# Patient Record
Sex: Female | Born: 1990 | Race: Black or African American | Hispanic: No | Marital: Single | Smoking: Current every day smoker
Health system: Southern US, Community
[De-identification: ages and names within clinical notes are randomized; demographics above are authoritative.]

## PROBLEM LIST (undated history)

## (undated) DIAGNOSIS — J45909 Unspecified asthma, uncomplicated: Secondary | ICD-10-CM

---

## 2013-11-05 ENCOUNTER — Encounter (HOSPITAL_BASED_OUTPATIENT_CLINIC_OR_DEPARTMENT_OTHER): Payer: Self-pay | Admitting: Emergency Medicine

## 2013-11-05 ENCOUNTER — Emergency Department (HOSPITAL_BASED_OUTPATIENT_CLINIC_OR_DEPARTMENT_OTHER)
Admission: EM | Admit: 2013-11-05 | Discharge: 2013-11-05 | Disposition: A | Discharge status: Home / Self Care | Payer: 59 | Attending: Emergency Medicine | Admitting: Emergency Medicine

## 2013-11-05 DIAGNOSIS — N898 Other specified noninflammatory disorders of vagina: Secondary | ICD-10-CM

## 2013-11-05 DIAGNOSIS — N9489 Other specified conditions associated with female genital organs and menstrual cycle: Secondary | ICD-10-CM | POA: Insufficient documentation

## 2013-11-05 DIAGNOSIS — N899 Noninflammatory disorder of vagina, unspecified: Secondary | ICD-10-CM | POA: Insufficient documentation

## 2013-11-05 DIAGNOSIS — F17200 Nicotine dependence, unspecified, uncomplicated: Secondary | ICD-10-CM | POA: Insufficient documentation

## 2013-11-05 DIAGNOSIS — Z3202 Encounter for pregnancy test, result negative: Secondary | ICD-10-CM | POA: Insufficient documentation

## 2013-11-05 LAB — URINALYSIS, ROUTINE W REFLEX MICROSCOPIC
Glucose, UA: NEGATIVE mg/dL
Leukocytes, UA: NEGATIVE
Protein, ur: NEGATIVE mg/dL
Urobilinogen, UA: 1 mg/dL (ref 0.0–1.0)
pH: 6.5 (ref 5.0–8.0)

## 2013-11-05 LAB — WET PREP, GENITAL: Trich, Wet Prep: NONE SEEN

## 2013-11-05 LAB — PREGNANCY, URINE: Preg Test, Ur: NEGATIVE

## 2013-11-05 LAB — URINE MICROSCOPIC-ADD ON

## 2013-11-05 MED ORDER — HYDROCORTISONE 1 % EX CREA
TOPICAL_CREAM | CUTANEOUS | Status: AC
  Filled 2013-11-05: qty 28

## 2013-11-05 MED ORDER — HYDROCORTISONE 1 % EX CREA
TOPICAL_CREAM | Freq: Once | CUTANEOUS | Status: AC
  Administered 2013-11-05: 1 via TOPICAL

## 2013-11-05 NOTE — ED Notes (Signed)
Pt reports vaginal irritation that started yesterday.  Denies discharge

## 2013-11-05 NOTE — ED Provider Notes (Signed)
CSN: [id number]     Arrival date & time 11/05/13  0003 History   First MD Initiated Contact with Patient 11/05/13 0019     Chief Complaint  Patient presents with  . Vaginal Itching   (Consider location/radiation/quality/duration/timing/severity/associated sxs/prior Treatment) Patient is a 22 y.o. female presenting with vaginal itching.  Vaginal Itching   Pt reports 2 days of vaginal burning and itching. Denies any discharge, no bleeding or swelling. She reports may have reaction to new detergent. No history of same. No history of HSV. No exposure to latex  History reviewed. No pertinent past medical history. History reviewed. No pertinent past surgical history. History reviewed. No pertinent family history. History  Substance Use Topics  . Smoking status: Current Every Day Smoker -- 0.50 packs/day    Types: Cigarettes  . Smokeless tobacco: Not on file  . Alcohol Use: No     Comment: socially   OB History   Grav Para Term Preterm Abortions TAB SAB Ect Mult Living                 Review of Systems All other systems reviewed and are negative except as noted in HPI.   Allergies  Review of patient's allergies indicates not on file.  Home Medications  No current outpatient prescriptions on file. BP 127/112  Pulse 69  Temp(Src) 98.4 F (36.9 C) (Oral)  Resp 18  Ht 5\' 7"  (1.702 m)  Wt 130 lb (58.968 kg)  BMI 20.36 kg/m2  SpO2 100%  LMP 11/05/2013 Physical Exam  Nursing note and vitals reviewed. Constitutional: She is oriented to person, place, and time. She appears well-developed and well-nourished.  HENT:  Head: Normocephalic and atraumatic.  Eyes: EOM are normal. Pupils are equal, round, and reactive to light.  Neck: Normal range of motion. Neck supple.  Cardiovascular: Normal rate, normal heart sounds and intact distal pulses.   Pulmonary/Chest: Effort normal and breath sounds normal.  Abdominal: Bowel sounds are normal. She exhibits no distension. There is no  tenderness.  Genitourinary:  Several small bumps on labia minora bilaterally, no vesicles or ulcers. No discharge or bleeding, unable to tolerate full speculum exam or bimanual  Musculoskeletal: Normal range of motion. She exhibits no edema and no tenderness.  Neurological: She is alert and oriented to person, place, and time. She has normal strength. No cranial nerve deficit or sensory deficit.  Skin: Skin is warm and dry. No rash noted.  Psychiatric: She has a normal mood and affect.    ED Course  Procedures (including critical care time) Labs Review Labs Reviewed  WET PREP, GENITAL - Abnormal; Notable for the following:    Clue Cells Wet Prep HPF POC FEW (*)    WBC, Wet Prep HPF POC FEW (*)    All other components within normal limits  URINALYSIS, ROUTINE W REFLEX MICROSCOPIC - Abnormal; Notable for the following:    Hgb urine dipstick TRACE (*)    All other components within normal limits  URINE MICROSCOPIC-ADD ON - Abnormal; Notable for the following:    Bacteria, UA MANY (*)    All other components within normal limits  GC/CHLAMYDIA PROBE AMP  PREGNANCY, URINE   Imaging Review No results found.  EKG Interpretation   None       MDM   1. Vaginal irritation     Rash not consistent with HSV. Suspect contact dermatitis of some sort. Advised topical hydrocortisone cream. Gyn followup if not improving.     Charles B. Bernette Mayers,  MD 11/05/13 1610

## 2015-02-15 ENCOUNTER — Emergency Department (HOSPITAL_BASED_OUTPATIENT_CLINIC_OR_DEPARTMENT_OTHER)
Admission: EM | Admit: 2015-02-15 | Discharge: 2015-02-15 | Disposition: A | Discharge status: Home / Self Care | Payer: 59 | Attending: Emergency Medicine | Admitting: Emergency Medicine

## 2015-02-15 ENCOUNTER — Encounter (HOSPITAL_BASED_OUTPATIENT_CLINIC_OR_DEPARTMENT_OTHER): Payer: Self-pay | Admitting: *Deleted

## 2015-02-15 ENCOUNTER — Emergency Department (HOSPITAL_BASED_OUTPATIENT_CLINIC_OR_DEPARTMENT_OTHER): Payer: 59

## 2015-02-15 DIAGNOSIS — Z720 Tobacco use: Secondary | ICD-10-CM | POA: Insufficient documentation

## 2015-02-15 DIAGNOSIS — J45901 Unspecified asthma with (acute) exacerbation: Secondary | ICD-10-CM | POA: Diagnosis not present

## 2015-02-15 DIAGNOSIS — J189 Pneumonia, unspecified organism: Secondary | ICD-10-CM

## 2015-02-15 DIAGNOSIS — R05 Cough: Secondary | ICD-10-CM | POA: Diagnosis present

## 2015-02-15 DIAGNOSIS — J159 Unspecified bacterial pneumonia: Secondary | ICD-10-CM | POA: Insufficient documentation

## 2015-02-15 DIAGNOSIS — R079 Chest pain, unspecified: Secondary | ICD-10-CM

## 2015-02-15 HISTORY — DX: Unspecified asthma, uncomplicated: J45.909

## 2015-02-15 MED ORDER — ALBUTEROL SULFATE HFA 108 (90 BASE) MCG/ACT IN AERS
2.0000 | INHALATION_SPRAY | Freq: Once | RESPIRATORY_TRACT | Status: DC
  Filled 2015-02-15: qty 6.7

## 2015-02-15 MED ORDER — IPRATROPIUM-ALBUTEROL 0.5-2.5 (3) MG/3ML IN SOLN
3.0000 mL | RESPIRATORY_TRACT | Status: DC
  Administered 2015-02-15: 3 mL via RESPIRATORY_TRACT

## 2015-02-15 MED ORDER — DEXAMETHASONE 4 MG PO TABS
12.0000 mg | ORAL_TABLET | Freq: Once | ORAL | Status: AC
  Administered 2015-02-15: 12 mg via ORAL
  Filled 2015-02-15: qty 3

## 2015-02-15 MED ORDER — ALBUTEROL SULFATE HFA 108 (90 BASE) MCG/ACT IN AERS: 2.0000 | INHALATION_SPRAY | RESPIRATORY_TRACT | Status: AC | PRN

## 2015-02-15 MED ORDER — LEVOFLOXACIN 750 MG PO TABS
750.0000 mg | ORAL_TABLET | Freq: Once | ORAL | Status: AC
  Administered 2015-02-15: 750 mg via ORAL
  Filled 2015-02-15: qty 1

## 2015-02-15 MED ORDER — LEVOFLOXACIN 750 MG PO TABS: 750.0000 mg | ORAL_TABLET | Freq: Every day | ORAL | Status: AC

## 2015-02-15 NOTE — Discharge Instructions (Signed)
Pneumonia °Pneumonia is an infection of the lungs.  °CAUSES °Pneumonia may be caused by bacteria or a virus. Usually, these infections are caused by breathing infectious particles into the lungs (respiratory tract). °SIGNS AND SYMPTOMS  °· Cough. °· Fever. °· Chest pain. °· Increased rate of breathing. °· Wheezing. °· Mucus production. °DIAGNOSIS  °If you have the common symptoms of pneumonia, your health care provider will typically confirm the diagnosis with a chest X-ray. The X-ray will show an abnormality in the lung (pulmonary infiltrate) if you have pneumonia. Other tests of your blood, urine, or sputum may be done to find the specific cause of your pneumonia. Your health care provider may also do tests (blood gases or pulse oximetry) to see how well your lungs are working. °TREATMENT  °Some forms of pneumonia may be spread to other people when you cough or sneeze. You may be asked to wear a mask before and during your exam. Pneumonia that is caused by bacteria is treated with antibiotic medicine. Pneumonia that is caused by the influenza virus may be treated with an antiviral medicine. Most other viral infections must run their course. These infections will not respond to antibiotics.  °HOME CARE INSTRUCTIONS  °· Cough suppressants may be used if you are losing too much rest. However, coughing protects you by clearing your lungs. You should avoid using cough suppressants if you can. °· Your health care provider may have prescribed medicine if he or she thinks your pneumonia is caused by bacteria or influenza. Finish your medicine even if you start to feel better. °· Your health care provider may also prescribe an expectorant. This loosens the mucus to be coughed up. °· Take medicines only as directed by your health care provider. °· Do not smoke. Smoking is a common cause of bronchitis and can contribute to pneumonia. If you are a smoker and continue to smoke, your cough may last several weeks after your  pneumonia has cleared. °· A cold steam vaporizer or humidifier in your room or home may help loosen mucus. °· Coughing is often worse at night. Sleeping in a semi-upright position in a recliner or using a couple pillows under your head will help with this. °· Get rest as you feel it is needed. Your body will usually let you know when you need to rest. °PREVENTION °A pneumococcal shot (vaccine) is available to prevent a common bacterial cause of pneumonia. This is usually suggested for: °· People over 65 years old. °· Patients on chemotherapy. °· People with chronic lung problems, such as bronchitis or emphysema. °· People with immune system problems. °If you are over 65 or have a high risk condition, you may receive the pneumococcal vaccine if you have not received it before. In some countries, a routine influenza vaccine is also recommended. This vaccine can help prevent some cases of pneumonia. You may be offered the influenza vaccine as part of your care. °If you smoke, it is time to quit. You may receive instructions on how to stop smoking. Your health care provider can provide medicines and counseling to help you quit. °SEEK MEDICAL CARE IF: °You have a fever. °SEEK IMMEDIATE MEDICAL CARE IF:  °· Your illness becomes worse. This is especially true if you are elderly or weakened from any other disease. °· You cannot control your cough with suppressants and are losing sleep. °· You begin coughing up blood. °· You develop pain which is getting worse or is uncontrolled with medicines. °· Any of the symptoms   which initially brought you in for treatment are getting worse rather than better. °· You develop shortness of breath or chest pain. °MAKE SURE YOU:  °· Understand these instructions. °· Will watch your condition. °· Will get help right away if you are not doing well or get worse. °Document Released: 11/18/2005 Document Revised: 04/04/2014 Document Reviewed: 02/07/2011 °ExitCare® Patient Information ©2015  [employer]. This information is not intended to replace advice given to you by your health care provider. Make sure you discuss any questions you have with your health care provider. ° ° °Emergency Department Resource Guide °1) Find a Doctor and Pay Out of Pocket °Although you won't have to find out who is covered by your insurance plan, it is a good idea to ask around and get recommendations. You will then need to call the office and see if the doctor you have chosen will accept you as a new patient and what types of options they offer for patients who are self-pay. Some doctors offer discounts or will set up payment plans for their patients who do not have insurance, but you will need to ask so you aren't surprised when you get to your appointment. ° °2) Contact Your Local Health Department °Not all health departments have doctors that can see patients for sick visits, but many do, so it is worth a call to see if yours does. If you don't know where your local health department is, you can check in your phone book. The CDC also has a tool to help you locate your state's health department, and many state websites also have listings of all of their local health departments. ° °3) Find a Walk-in Clinic °If your illness is not likely to be very severe or complicated, you may want to try a walk in clinic. These are popping up all over the country in pharmacies, drugstores, and shopping centers. They're usually staffed by nurse practitioners or physician assistants that have been trained to treat common illnesses and complaints. They're usually fairly quick and inexpensive. However, if you have serious medical issues or chronic medical problems, these are probably not your best option. ° °No Primary Care Doctor: °- Call [facility] at  832-8000 - they can help you locate a primary care doctor that  accepts your insurance, provides certain services, etc. °- Physician Referral Service- 1-800-533-3463 ° °Chronic Pain  Problems: °Organization         Address  Phone   Notes  °[facility]  (336) 297-2271 Patients need to be referred by their primary care doctor.  ° °Medication Assistance: °Organization         Address  Phone   Notes  °[place] [facility] [address] °[place], [place] [address] (336) 641-8030 --Must be a resident of [place] °-- Must have NO insurance coverage whatsoever (no Medicaid/ Medicare, etc.) °-- The pt. MUST have a primary care doctor that directs their care regularly and follows them in the community °  °[facility]  (866) 331-1348   °[employer]  (888) 892-1162   ° °Agencies that provide inexpensive medical care: °Organization         Address  Phone   Notes  °[facility]  (336) 832-8035   °[facility]    (336) 832-7272   °[facility] [address] °[place], [place] [address] (336) 832-4777   °[facility] [address], °[place] (336) 271-4999   °  [facility]    (336) 373-0678   °[facility]    (336) 272-1050   °[facility] ° [address], [place] Phone:  (336) 832-4444, Fax:  (336) 832-4440 Hours of Operation:  9 am - 6 pm, M-F.  Also accepts Medicaid/Medicare and self-pay.  °[facility] ° [address], [place] Phone: (336) [phone], Fax: (336) [phone]. Hours of Operation:  8:30 am - 5:30 pm, M-F.  Also accepts Medicaid and self-pay.  °[facility] [place] [address], [place] Phone: (336) 878-6027   °[facility] [address], [place], [place] (336)723-1848, Ext. 123 Mondays & Thursdays: 7-9 AM.  First 15 patients are seen on a first come, first serve basis. °  ° °Medicaid-accepting [place] Providers: ° °Organization         Address  Phone   Notes  °[facility] [address], [place] (336) 641-2100 Also  accepts self-pay patients.  °[facility] [address], [place] ° (336) 856-9996   °[facility] [address], [place] (336) 288-8857   °[facility] [address] [place] [address], [place] (336) 299-7000   °Veita Bland [address], [place]  ° (336) 373-1557 Only accepts [place] Access Medicaid patients after they have their name applied to their card.  ° °Self-Pay (no insurance) in [place]: ° °Organization         Address  Phone   Notes  °Sickle Cell Patients, [facility] [address], [place] (336) 832-1970   °[facility] [address], [place] (336) 832-4400   ° [facility] ° [address], [place] (336) 992-4800   °[facility]/Dr. Osei-Bonsu ° [address] [place] [address], [place] or [address], [place] (336) 841-8500 Phone number for both [place] and [place] locations is the same.  °[facility] [address], [place] (336) 299-0000   °[facility] [address] [place] [address], [place] or [address] (336) 852-7530 °(336) 878-2260   °[facility] [address], [place] (336) 350-1642, phone; (336) 294-5005, fax Sees patients 1st and 3rd Saturday of every month.  Must not qualify for public or private insurance (i.e. Medicaid, Medicare, [place] Health Choice, Veterans' Benefits) • Household income should be no more than 200% of the poverty level •The clinic cannot treat you if you are pregnant or think you are pregnant • Sexually transmitted diseases are not treated at the clinic.  ° ° °Dental Care: °Organization         Address  Phone  Notes  °[facility] [address], [place] (336) 641-6152 Accepts children up to age 21 who are enrolled in Medicaid or [place] Health Choice; pregnant  women with a Medicaid card; and children who have applied for Medicaid or [place] Health Choice, but were declined, whose parents can pay a reduced fee at time of service.  °[facility] [place]  [address], [place] (336) 641-7733 Accepts children up to age 21 who are enrolled in Medicaid or [place] Health Choice; pregnant women with a Medicaid card; and children who have applied for Medicaid or  Health Choice, but were declined,   whose parents can pay a reduced fee at time of service.  °[facility] ° [address], [place] (336) 641-4533 Patients are seen by appointment only. Walk-ins are not accepted. [facility] will see patients 18 years of age and older. °Monday - Tuesday (8am-5pm) °Most Wednesdays (8:30-5pm) °$30 per visit, cash only  °[facility] ° [address], [place] (336) 641-4533 Patients are seen by appointment only. Walk-ins are not accepted. [facility] will see patients 18 years of age and older. °One Wednesday Evening (Monthly: Volunteer Based).  $30 per visit, cash only  °[facility]  (919) 537-3737 for adults; Children under age 4, call Graduate Pediatric Dentistry at ([phone]) [phone]. Children aged 4-14, please call (919) [phone] to request a pediatric application. ° Dental services are provided in all areas of dental care including fillings, crowns and bridges, complete and partial dentures, implants, gum treatment, root canals, and extractions. Preventive care is also provided. Treatment is provided to both adults and children. °Patients are selected via a lottery and there is often a waiting list. °  °[facility] [address], °[place] ° (336) 763-8833 [email or web address] °  °[facility] [address], [place], [place] (336)723-1848, Ext. 123 Second and Fourth Thursday of each month, opens at 6:30 AM; Clinic ends at 9 AM.  Patients are  seen on a first-come first-served basis, and a limited number are seen during each clinic.  ° °[facility] ° [address], [place], [place] (336) 723-7904   Eligibility Requirements °You must have lived in [place], [place], or [place] counties for at least the last three months. °  You cannot be eligible for state or federal sponsored healthcare insurance, including Veterans Administration, Medicaid, or Medicare. °  You generally cannot be eligible for healthcare insurance through your employer.  °  How to apply: °Eligibility screenings are held every Tuesday and Wednesday afternoon from 1:00 pm until 4:00 pm. You do not need an appointment for the interview!  °[facility] [address], [place], [place] 336-631-2330   °[facility]  336-342-8273   °[facility]  336-703-3100   °[facility]  336-570-6415   ° °Behavioral Health Resources in the Community: °Intensive Outpatient Programs °Organization         Address  Phone  Notes  °[facility] [address], [place], [place] 336-878-6098   °[facility] [address], [place], [place] 336-832-9800   °[facility] [address], [place], [place] ° 336-882-2125   °[facility] [address],  °[place], [place] 1-800-853-5163 or 336-641-4981   °Substance Abuse Resources °Organization         Address  Phone  Notes  °[facility]  336-882-2125   °[facility]  336-784-9470   °[facility]  336-285-9073   °[facility]  336-845-3988   °Residential & Outpatient Substance Abuse Program  1-800-659-3381   °Psychological Services °Organization         Address  Phone  Notes  °[facility]  336- 832-9600   °[facility]  336- 378-7881   °[facility] [address], [place] 1-800-853-5163 or 336-641-4981   ° °Mobile Crisis  Teams °Organization         Address  Phone  Notes  °[facility]   Crisis Care Unit  1-877-626-1772   °Assertive °[facility] ° [address] [place], [place] 336-834-9664   °Sharon DeEsch [address] °[place] 336-554-5454   ° °Self-Help/Support Groups °Organization         Address  Phone             Notes  °[facility] - variety of support groups  336- 373-1402 Call for more information  °Narcotics Anonymous (NA), [facility] [address], °[place]  2 meetings at this location  ° °Residential Treatment Programs °Organization         Address  Phone  Notes  °[facility] [address],    °[place]  1-866-801-8205   °[facility] ° [address], [place], [place] 704-293-8524   °[facility] [address], [place] 336-845-3988 Admissions: 8am-3pm M-F  °[facility] [address]    °[place], [place] 336-841-1104   °[facility] [address], [place], [place] 336-379-7146   °[facility] [address]  °[place], [place] 336-285-9073   °[facility] - Intensive Outpatient [address], , [place] 336-852-3033   °[facility] [address]  °[place], [place] 1-877-615-2722 or 336-784-9470   °[facility] [address] [place], [place] 336-227-7417 Accepts Medicaid  °[facility] [address]  ° [place] 1-800-659-3381 Substance Abuse/Addiction Treatment  ° °[place] Behavioral Health Resources °Organization         Address  Phone  Notes  °[facility]  (888) 581-9988   °Julie Brannon, PhD [address] [place], [place]   (336) 349-5553 or (336) 951-0000   °[facility]   [address] °[place], [place] (336) 349-4454   °[facility] [address], [place], [place] (336) 342-8316  Insurance/Medicaid/sponsorship through [facility]  °[facility] [address]                                    [place], [place] (336) 342-8316 Therapy/tele-psych/case  °[facility] [address]  ° [place], [place] (336) 349-2233    °Dr. Arfeen  (336) 349-4544   °[facility] [place]  [employer] [place] [facility] 1) [address], [place] °2) [address], [place] °3)  [address], [place] (336) 349-3220 °(336) 342-7768 ° °(336) 342-8140   °[place] Child Abuse [facility] (336) 342-1394 or (336) 342-3537 (After Hours)    ° ° ° °

## 2015-02-15 NOTE — ED Provider Notes (Signed)
CSN: [id number]     Arrival date & time 02/15/15  1600 History   First MD Initiated Contact with Patient 02/15/15 1610     Chief Complaint  Patient presents with  . Chest Pain     (Consider location/radiation/quality/duration/timing/severity/associated sxs/prior Treatment) Patient is a 24 y.o. female presenting with chest pain. The history is provided by the patient.  Chest Pain Pain location:  Substernal area Pain quality: aching and sharp   Pain radiates to:  Does not radiate Pain radiates to the back: no   Onset quality:  Gradual Duration:  1 week Timing:  Constant Progression:  Unchanged Chronicity:  New Context: breathing, lifting, movement and raising an arm   Relieved by:  Nothing Worsened by:  Coughing and movement Associated symptoms: cough and shortness of breath   Associated symptoms: no abdominal pain, no fever and not vomiting     Past Medical History  Diagnosis Date  . Asthma    History reviewed. No pertinent past surgical history. No family history on file. History  Substance Use Topics  . Smoking status: Current Every Day Smoker -- 0.50 packs/day    Types: Cigarettes  . Smokeless tobacco: Not on file  . Alcohol Use: Yes     Comment: socially   OB History    No data available     Review of Systems  Constitutional: Negative for fever and chills.  HENT: Negative for rhinorrhea.   Respiratory: Positive for cough and shortness of breath.        No hemoptysis  Cardiovascular: Positive for chest pain.  Gastrointestinal: Negative for vomiting and abdominal pain.  All other systems reviewed and are negative.     Allergies  Review of patient's allergies indicates no known allergies.  Home Medications   Prior to Admission medications   Not on File   BP 126/60 mmHg  Pulse 69  Temp(Src) 98.5 F (36.9 C) (Oral)  Resp 18  Ht  (1.702 m)  Wt 125 lb (56.7 kg)  BMI 19.57 kg/m2  SpO2 100%  LMP 02/08/2015 Physical Exam  Constitutional: She  is oriented to person, place, and time. She appears well-developed and well-nourished. No distress.  HENT:  Head: Normocephalic and atraumatic.  Mouth/Throat: Oropharynx is clear and moist.  Eyes: EOM are normal. Pupils are equal, round, and reactive to light.  Neck: Normal range of motion. Neck supple.  Cardiovascular: Normal rate and regular rhythm.  Exam reveals no friction rub.   No murmur heard. Pulmonary/Chest: Effort normal. No respiratory distress. She has decreased breath sounds (diffusely). She has no wheezes. She has no rales. She exhibits tenderness (central).  Abdominal: Soft. She exhibits no distension. There is no tenderness. There is no rebound.  Musculoskeletal: Normal range of motion. She exhibits no edema.  Neurological: She is alert and oriented to person, place, and time. No cranial nerve deficit. She exhibits normal muscle tone. Coordination normal.  Skin: Skin is warm. No rash noted. She is not diaphoretic.  Nursing note and vitals reviewed.   ED Course  Procedures (including critical care time) Labs Review Labs Reviewed - No data to display  Imaging Review Dg Chest 2 View  02/15/2015   CLINICAL DATA:  Mid sternal chest pain and tenderness. History of asthma.  EXAM: CHEST  2 VIEW  COMPARISON:  None.  FINDINGS: Normal cardiac silhouette and mediastinal contours. Ill-defined potential developing airspace opacity within the right lower lung. No pleural effusion pneumothorax. No evidence of edema. No acute osseus abnormalities.  IMPRESSION: Findings worrisome for developing right lower lung pneumonia.   Electronically Signed   By: Simonne ComeJohn  Watts M.D.   On: 02/15/2015 16:36     EKG Interpretation   Date/Time:  Wednesday February 15 2015 16:09:26 EDT Ventricular Rate:  66 PR Interval:  134 QRS Duration: 78 QT Interval:  388 QTC Calculation: 406 R Axis:   84 Text Interpretation:  Normal sinus rhythm Normal ECG No prior for  comparison Confirmed by Gwendolyn GrantWALDEN  MD, Renton Berkley  ([id number]) on 02/15/2015 4:11:06 PM      MDM   Final diagnoses:  Chest pain  Community acquired pneumonia    24 year old female here chest pain. History of asthma. Has been coughing for the past week. Pain is described as sharp and achy periods worse with coughing, movement, raising her arms. Denies any hemoptysis, exogenous estrogen use. She is a smoker. I did counsel her on smoking cessation. She has a lot of central chest tenderness on exam. Think this is likely chest wall pain. I doubt PE. Patient has diffusely decreased air movement. We'll give steroids. We'll get a chest x-ray. Albuterol given. Hx of asthma.  CXR shows early pneumonia. Levaquin initiated. Plan on discharge, feeling better after breathing treatments. Given resource guide for follow-up.  I have reviewed all labs and imaging and considered them in my medical decision making.   Elwin MochaBlair Delrae Hagey, MD 02/15/15 [phone]

## 2015-02-15 NOTE — ED Notes (Signed)
Pt c/o central cp with sob x1 week.

## 2015-02-15 NOTE — ED Notes (Signed)
Patient transported to X-ray 

## 2015-02-15 NOTE — ED Notes (Signed)
Mid sternal area is tender, pain increases with movements, positioning, raising arms and deep inspiration.

## 2015-06-09 ENCOUNTER — Encounter (HOSPITAL_COMMUNITY): Payer: Self-pay | Admitting: Emergency Medicine

## 2015-06-09 ENCOUNTER — Emergency Department (HOSPITAL_COMMUNITY)
Admission: EM | Admit: 2015-06-09 | Discharge: 2015-06-09 | Disposition: A | Discharge status: Home / Self Care | Payer: 59 | Attending: Emergency Medicine | Admitting: Emergency Medicine

## 2015-06-09 DIAGNOSIS — Z79899 Other long term (current) drug therapy: Secondary | ICD-10-CM | POA: Diagnosis not present

## 2015-06-09 DIAGNOSIS — M5441 Lumbago with sciatica, right side: Secondary | ICD-10-CM

## 2015-06-09 DIAGNOSIS — J45909 Unspecified asthma, uncomplicated: Secondary | ICD-10-CM | POA: Insufficient documentation

## 2015-06-09 DIAGNOSIS — M549 Dorsalgia, unspecified: Secondary | ICD-10-CM | POA: Diagnosis present

## 2015-06-09 DIAGNOSIS — Z720 Tobacco use: Secondary | ICD-10-CM | POA: Diagnosis not present

## 2015-06-09 MED ORDER — IBUPROFEN 800 MG PO TABS: 800.0000 mg | ORAL_TABLET | Freq: Three times a day (TID) | ORAL | Status: AC

## 2015-06-09 MED ORDER — KETOROLAC TROMETHAMINE 60 MG/2ML IM SOLN
60.0000 mg | Freq: Once | INTRAMUSCULAR | Status: AC
  Administered 2015-06-09: 60 mg via INTRAMUSCULAR
  Filled 2015-06-09: qty 2

## 2015-06-09 MED ORDER — CYCLOBENZAPRINE HCL 10 MG PO TABS: 10.0000 mg | ORAL_TABLET | Freq: Two times a day (BID) | ORAL | Status: DC | PRN

## 2015-06-09 MED ORDER — PREDNISONE 20 MG PO TABS: 40.0000 mg | ORAL_TABLET | Freq: Every day | ORAL | Status: DC

## 2015-06-09 MED ORDER — DIAZEPAM 5 MG PO TABS
5.0000 mg | ORAL_TABLET | Freq: Once | ORAL | Status: AC
  Administered 2015-06-09: 5 mg via ORAL
  Filled 2015-06-09: qty 1

## 2015-06-09 MED ORDER — PREDNISONE 20 MG PO TABS
40.0000 mg | ORAL_TABLET | Freq: Every day | ORAL | Status: AC
Start: 2015-06-09 — End: ?

## 2015-06-09 MED ORDER — PREDNISONE 20 MG PO TABS
60.0000 mg | ORAL_TABLET | Freq: Once | ORAL | Status: AC
  Administered 2015-06-09: 60 mg via ORAL
  Filled 2015-06-09: qty 3

## 2015-06-09 MED ORDER — IBUPROFEN 800 MG PO TABS
800.0000 mg | ORAL_TABLET | Freq: Three times a day (TID) | ORAL | Status: DC
Start: 2015-06-09 — End: 2015-06-09

## 2015-06-09 MED ORDER — HYDROCODONE-ACETAMINOPHEN 5-325 MG PO TABS: 2.0000 | ORAL_TABLET | ORAL | Status: DC | PRN

## 2015-06-09 MED ORDER — HYDROCODONE-ACETAMINOPHEN 5-325 MG PO TABS: 2.0000 | ORAL_TABLET | ORAL | Status: AC | PRN

## 2015-06-09 NOTE — ED Provider Notes (Signed)
CSN: [id number]     Arrival date & time 06/09/15  1903 History  This chart was scribed for non-physician provider Danelle BerryLeisa Annalaura Sauseda, PA-C, working with Gilda Creasehristopher J Pollina, MD by Phillis HaggisGabriella Gaje, ED Scribe. This patient was seen in room WTR7/WTR7 and patient care was started at 7:48 PM.    No chief complaint on file.  The history is provided by the patient. No language interpreter was used.  HPI Comments: Stephanie Robertson is a 24 y.o. female who presents to the Emergency Department complaining of gradually worsening, constant, sharp, aching bilateral lower back pain, right buttock and side pain onset one week ago. She reports certain positions relieve the pain and currently rates the pain 9/10. She reports pain with standing and walking and worsening, radiating pain with palpation; reports using ibuprofen to no relief. Reports additional numbness in right toes when the pain is shooting. She denies any recent injury or heavy lifting and does not know what caused the pain. She denies vaginal discharge, itching, dysuria, hematuria, nausea, vomiting, fever, chills, weakness, or diaphoresis. Denies hx of cancer, IV or street drug use.   Past Medical History  Diagnosis Date  . Asthma    History reviewed. No pertinent past surgical history. History reviewed. No pertinent family history. History  Substance Use Topics  . Smoking status: Current Every Day Smoker -- 0.50 packs/day    Types: Cigarettes  . Smokeless tobacco: Not on file  . Alcohol Use: Yes     Comment: socially   OB History    No data available     Review of Systems  Constitutional: Negative for fever, chills and diaphoresis.  Gastrointestinal: Negative for nausea, vomiting and diarrhea.  Genitourinary: Negative for dysuria, hematuria, vaginal discharge and vaginal pain.  Musculoskeletal: Positive for myalgias and back pain.  Neurological: Negative for weakness and numbness.   Allergies  Review of patient's allergies indicates no  known allergies.  Home Medications   Prior to Admission medications   Medication Sig Start Date End Date Taking? Authorizing Provider  albuterol (PROVENTIL HFA;VENTOLIN HFA) 108 (90 BASE) MCG/ACT inhaler Inhale 2 puffs into the lungs every 4 (four) hours as needed for wheezing or shortness of breath. 02/15/15   Elwin MochaBlair Walden, MD  levofloxacin (LEVAQUIN) 750 MG tablet Take 1 tablet (750 mg total) by mouth daily. X 7 days 02/15/15   Elwin MochaBlair Walden, MD   BP 136/89 mmHg  Pulse 95  Temp(Src) 98.4 F (36.9 C) (Oral)  Resp 14  SpO2 100%  LMP 05/06/2015  Physical Exam  Constitutional: She is oriented to person, place, and time. She appears well-developed and well-nourished.  HENT:  Head: Normocephalic and atraumatic.  Eyes: Conjunctivae and EOM are normal. Pupils are equal, round, and reactive to light. No scleral icterus.  Neck: Normal range of motion. Neck supple. No JVD present.  Cardiovascular: Normal rate, regular rhythm and normal heart sounds.  Exam reveals no gallop and no friction rub.   No murmur heard. Pulmonary/Chest: Effort normal and breath sounds normal. No respiratory distress.  Abdominal: Soft. Normal appearance and bowel sounds are normal. She exhibits no distension. There is no tenderness. There is no rigidity, no guarding and no CVA tenderness.  Musculoskeletal: Normal range of motion.       Cervical back: Normal.       Thoracic back: Normal.       Lumbar back: She exhibits normal range of motion, no bony tenderness and no swelling.  TTP low right back, lumbar paraspinal and over right  buttock  Neurological: She is alert and oriented to person, place, and time.  Skin: Skin is warm and dry.  Psychiatric: She has a normal mood and affect. Her behavior is normal.  Nursing note and vitals reviewed.   ED Course  Procedures (including critical care time) DIAGNOSTIC STUDIES: Oxygen Saturation is 100% on RA, normal by my interpretation.    COORDINATION OF CARE: 7:51  PM-Discussed treatment plan which includes oral steroids and pain medication with pt at bedside and pt agreed to plan. Told pt to follow up with PCP in [facility].   Labs Review Labs Reviewed - No data to display  Imaging Review No results found.   EKG Interpretation None      MDM   Final diagnoses:  None    Patient with low right back pain with sciatica Patient with back pain.  No neurological deficits and normal neuro exam.  Patient can walk but states is painful.  No loss of bowel or bladder control.  No concern for cauda equina.  No fever, night sweats, weight loss, h/o cancer, IVDU.  RICE protocol and pain medicine indicated and discussed with patient.   Medications  ketorolac (TORADOL) injection 60 mg (not administered)  predniSONE (DELTASONE) tablet 60 mg (not administered)  diazepam (VALIUM) tablet 5 mg (not administered)   Filed Vitals:   06/09/15 1909  BP: 136/89  Pulse: 95  Temp: 98.4 F (36.9 C)  TempSrc: Oral  Resp: 14  SpO2: 100%    I personally performed the services described in this documentation, which was scribed in my presence. The recorded information has been reviewed and is accurate.    Danelle Berry, PA-C 06/15/15 1610  Gilda Crease, MD 06/17/15 [phone]

## 2015-06-09 NOTE — ED Notes (Signed)
Pt reports bilateral lower back pain, right buttock, and right side pain. Denies dysuria or itching. Pain started a week ago. Denies injury/trauma. Denies N/V/D/F. No other c/c.

## 2015-06-09 NOTE — Discharge Instructions (Signed)
Back Pain, Adult °Back pain is very common. The pain often gets better over time. The cause of back pain is usually not dangerous. Most people can learn to manage their back pain on their own.  °HOME CARE  °· Stay active. Start with short walks on flat ground if you can. Try to walk farther each day. °· Do not sit, drive, or stand in one place for more than 30 minutes. Do not stay in bed. °· Do not avoid exercise or work. Activity can help your back heal faster. °· Be careful when you bend or lift an object. Bend at your knees, keep the object close to you, and do not twist. °· Sleep on a firm mattress. Lie on your side, and bend your knees. If you lie on your back, put a pillow under your knees. °· Only take medicines as told by your doctor. °· Put ice on the injured area. °· Put ice in a plastic bag. °· Place a towel between your skin and the bag. °· Leave the ice on for 15-20 minutes, 03-04 times a day for the first 2 to 3 days. After that, you can switch between ice and heat packs. °· Ask your doctor about back exercises or massage. °· Avoid feeling anxious or stressed. Find good ways to deal with stress, such as exercise. °GET HELP RIGHT AWAY IF:  °· Your pain does not go away with rest or medicine. °· Your pain does not go away in 1 week. °· You have new problems. °· You do not feel well. °· The pain spreads into your legs. °· You cannot control when you poop (bowel movement) or pee (urinate). °· Your arms or legs feel weak or lose feeling (numbness). °· You feel sick to your stomach (nauseous) or throw up (vomit). °· You have belly (abdominal) pain. °· You feel like you may pass out (faint). °MAKE SURE YOU:  °· Understand these instructions. °· Will watch your condition. °· Will get help right away if you are not doing well or get worse. °Document Released: 05/06/2008 Document Revised: 02/10/2012 Document Reviewed: 03/22/2014 °ExitCare® Patient Information ©2015 [employer]. This information is not intended  to replace advice given to you by your health care provider. Make sure you discuss any questions you have with your health care provider. °Sciatica °Sciatica is pain, weakness, numbness, or tingling along the path of the sciatic nerve. The nerve starts in the lower back and runs down the back of each leg. The nerve controls the muscles in the lower leg and in the back of the knee, while also providing sensation to the back of the thigh, lower leg, and the sole of your foot. Sciatica is a symptom of another medical condition. For instance, nerve damage or certain conditions, such as a herniated disk or bone spur on the spine, pinch or put pressure on the sciatic nerve. This causes the pain, weakness, or other sensations normally associated with sciatica. Generally, sciatica only affects one side of the body. °CAUSES  °· Herniated or slipped disc. °· Degenerative disk disease. °· A pain disorder involving the narrow muscle in the buttocks (piriformis syndrome). °· Pelvic injury or fracture. °· Pregnancy. °· Tumor (rare). °SYMPTOMS  °Symptoms can vary from mild to very severe. The symptoms usually travel from the low back to the buttocks and down the back of the leg. Symptoms can include: °· Mild tingling or dull aches in the lower back, leg, or hip. °· Numbness in the back   of the calf or sole of the foot. °· Burning sensations in the lower back, leg, or hip. °· Sharp pains in the lower back, leg, or hip. °· Leg weakness. °· Severe back pain inhibiting movement. °These symptoms may get worse with coughing, sneezing, laughing, or prolonged sitting or standing. Also, being overweight may worsen symptoms. °DIAGNOSIS  °Your caregiver will perform a physical exam to look for common symptoms of sciatica. He or she may ask you to do certain movements or activities that would trigger sciatic nerve pain. Other tests may be performed to find the cause of the sciatica. These may include: °· Blood tests. °· X-rays. °· Imaging  tests, such as an MRI or CT scan. °TREATMENT  °Treatment is directed at the cause of the sciatic pain. Sometimes, treatment is not necessary and the pain and discomfort goes away on its own. If treatment is needed, your caregiver may suggest: °· Over-the-counter medicines to relieve pain. °· Prescription medicines, such as anti-inflammatory medicine, muscle relaxants, or narcotics. °· Applying heat or ice to the painful area. °· Steroid injections to lessen pain, irritation, and inflammation around the nerve. °· Reducing activity during periods of pain. °· Exercising and stretching to strengthen your abdomen and improve flexibility of your spine. Your caregiver may suggest losing weight if the extra weight makes the back pain worse. °· Physical therapy. °· Surgery to eliminate what is pressing or pinching the nerve, such as a bone spur or part of a herniated disk. °HOME CARE INSTRUCTIONS  °· Only take over-the-counter or prescription medicines for pain or discomfort as directed by your caregiver. °· Apply ice to the affected area for 20 minutes, 3-4 times a day for the first 48-72 hours. Then try heat in the same way. °· Exercise, stretch, or perform your usual activities if these do not aggravate your pain. °· Attend physical therapy sessions as directed by your caregiver. °· Keep all follow-up appointments as directed by your caregiver. °· Do not wear high heels or shoes that do not provide proper support. °· Check your mattress to see if it is too soft. A firm mattress may lessen your pain and discomfort. °SEEK IMMEDIATE MEDICAL CARE IF:  °· You lose control of your bowel or bladder (incontinence). °· You have increasing weakness in the lower back, pelvis, buttocks, or legs. °· You have redness or swelling of your back. °· You have a burning sensation when you urinate. °· You have pain that gets worse when you lie down or awakens you at night. °· Your pain is worse than you have experienced in the past. °· Your  pain is lasting longer than 4 weeks. °· You are suddenly losing weight without reason. °MAKE SURE YOU: °· Understand these instructions. °· Will watch your condition. °· Will get help right away if you are not doing well or get worse. °Document Released: 11/12/2001 Document Revised: 05/19/2012 Document Reviewed: 03/29/2012 °ExitCare® Patient Information ©2015 [employer]. This information is not intended to replace advice given to you by your health care provider. Make sure you discuss any questions you have with your health care provider. ° °

## 2016-04-13 IMAGING — CR DG CHEST 2V
2 series · 2 of 2 positions shown · non-contrast
Comparison: None.

CLINICAL DATA: Mid sternal chest pain and tenderness. History of
asthma.

EXAM:
CHEST  2 VIEW

[w chest pa]
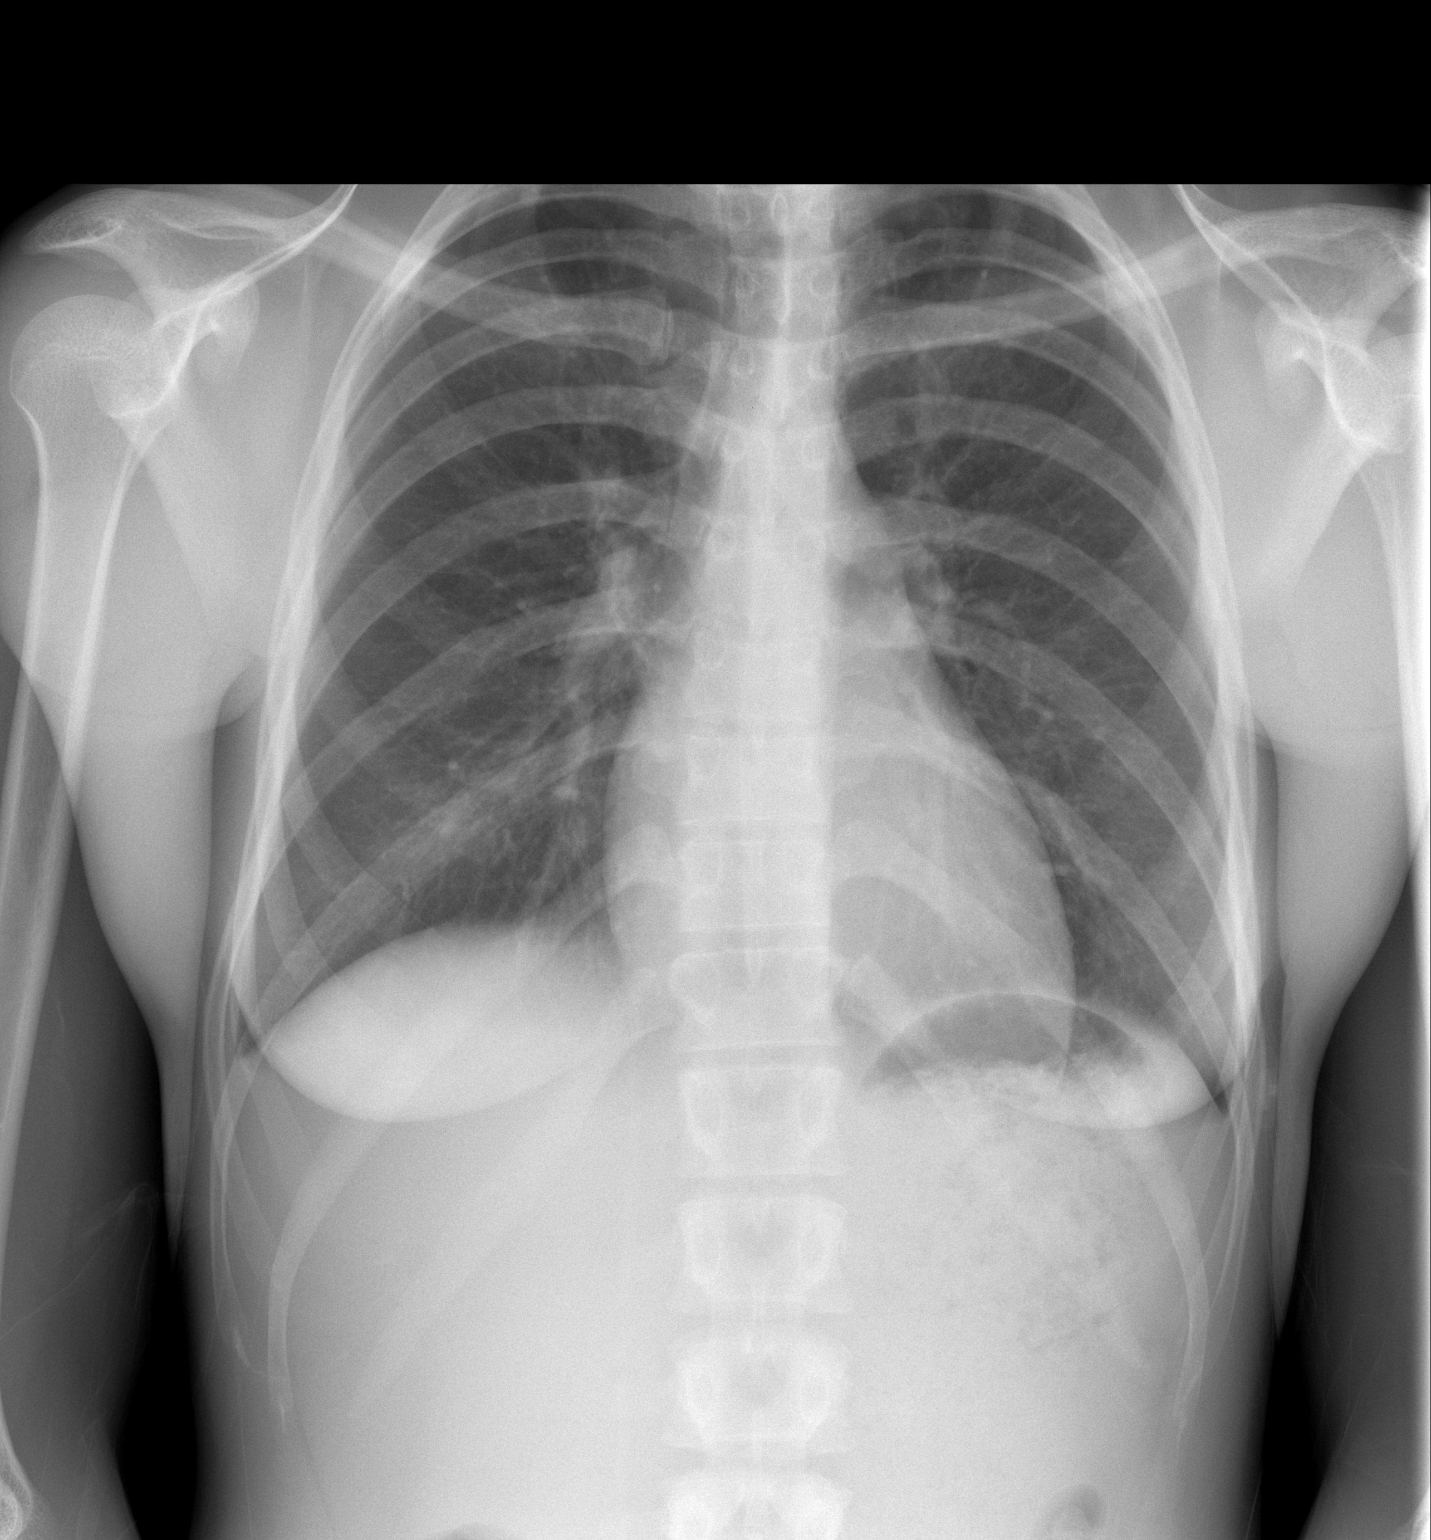

[w chest lat]
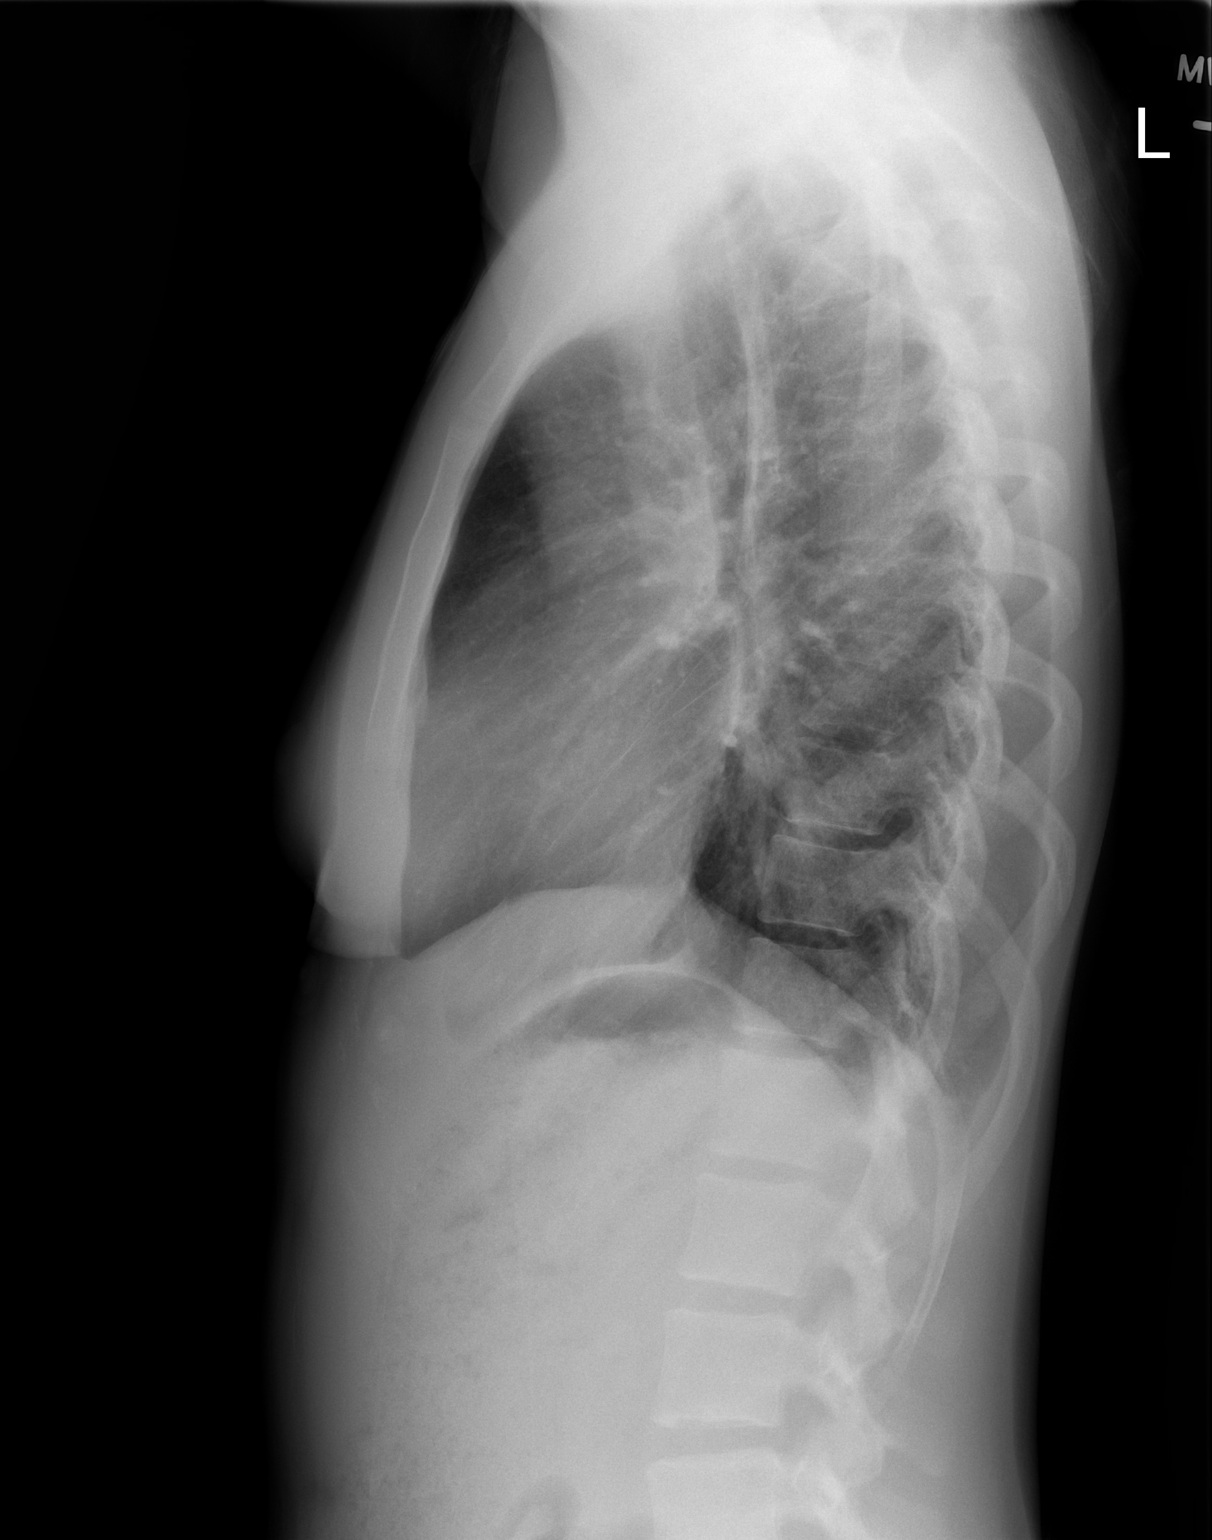

[2 of 2 positions shown; findings below may reference images not displayed]

FINDINGS: Normal cardiac silhouette and mediastinal contours. Ill-defined
potential developing airspace opacity within the right lower lung.
No pleural effusion pneumothorax. No evidence of edema. No acute
osseus abnormalities.
IMPRESSION: Findings worrisome for developing right lower lung pneumonia.

## 2017-04-08 ENCOUNTER — Emergency Department (HOSPITAL_COMMUNITY)
Admission: EM | Admit: 2017-04-08 | Discharge: 2017-04-08 | Disposition: A | Discharge status: Home / Self Care | Payer: 59 | Attending: Emergency Medicine | Admitting: Emergency Medicine

## 2017-04-08 ENCOUNTER — Encounter (HOSPITAL_COMMUNITY): Payer: Self-pay

## 2017-04-08 DIAGNOSIS — Y939 Activity, unspecified: Secondary | ICD-10-CM | POA: Diagnosis not present

## 2017-04-08 DIAGNOSIS — J45909 Unspecified asthma, uncomplicated: Secondary | ICD-10-CM | POA: Diagnosis not present

## 2017-04-08 DIAGNOSIS — Y999 Unspecified external cause status: Secondary | ICD-10-CM | POA: Diagnosis not present

## 2017-04-08 DIAGNOSIS — W57XXXA Bitten or stung by nonvenomous insect and other nonvenomous arthropods, initial encounter: Secondary | ICD-10-CM | POA: Diagnosis not present

## 2017-04-08 DIAGNOSIS — Z79899 Other long term (current) drug therapy: Secondary | ICD-10-CM | POA: Diagnosis not present

## 2017-04-08 DIAGNOSIS — S60561A Insect bite (nonvenomous) of right hand, initial encounter: Secondary | ICD-10-CM | POA: Diagnosis present

## 2017-04-08 DIAGNOSIS — Y929 Unspecified place or not applicable: Secondary | ICD-10-CM | POA: Diagnosis not present

## 2017-04-08 DIAGNOSIS — F17210 Nicotine dependence, cigarettes, uncomplicated: Secondary | ICD-10-CM | POA: Diagnosis not present

## 2017-04-08 MED ORDER — DIPHENHYDRAMINE HCL 25 MG PO CAPS: 25.0000 mg | ORAL_CAPSULE | Freq: Four times a day (QID) | ORAL | 0 refills | Status: AC | PRN

## 2017-04-08 MED ORDER — DIPHENHYDRAMINE HCL 25 MG PO CAPS
25.0000 mg | ORAL_CAPSULE | Freq: Once | ORAL | Status: DC
  Filled 2017-04-08: qty 1

## 2017-04-08 NOTE — ED Triage Notes (Signed)
Patient states she was bitten by something this AM while at work, but continued to work. Patient now has swelling to the right hand is tender to touch.

## 2017-04-08 NOTE — Discharge Instructions (Signed)
Read the information below.  Use the prescribed medication as directed.  Please discuss all new medications with your pharmacist.  You may return to the Emergency Department at any time for worsening condition or any new symptoms that concern you.    If you develop increased redness, swelling, pus draining from the wound, uncontrolled pain, difficulty moving your fingers, or fevers greater than 100.4, return to the ER immediately for a recheck.

## 2017-04-08 NOTE — ED Provider Notes (Signed)
[facility]-EMERGENCY DEPT Provider Note   CSN: [id number] Arrival date & time: 04/08/17  1555   By signing my name below, I, Soijett Blue, attest that this documentation has been prepared under the direction and in the presence of Trixie DredgeEmily Arika Mainer, PA-C Electronically Signed: Soijett Blue, ED Scribe. 04/08/17. 7:03 PM.  History   Chief Complaint Chief Complaint  Patient presents with  . Insect Bite    HPI Stephanie Robertson is a 26 y.o. female who presents to the Emergency Department complaining of probable insect bite to right hand onset 5 AM this morning. She notes that she [place] an airport and noticed an unknown bug on her right hand prior to the onset of her symptoms. She states that the affected area is burning and pruritic. Pt reports associated right hand swelling, mild right hand pain, and redness to the affected area. Pt has not tried any medications for the relief of her symptoms. She denies any other symptoms.    The history is provided by the patient. No language interpreter was used.    Past Medical History:  Diagnosis Date  . Asthma     There are no active problems to display for this patient.   History reviewed. No pertinent surgical history.  OB History    No data available       Home Medications    Prior to Admission medications   Medication Sig Start Date End Date Taking? Authorizing Provider  albuterol (PROVENTIL HFA;VENTOLIN HFA) 108 (90 BASE) MCG/ACT inhaler Inhale 2 puffs into the lungs every 4 (four) hours as needed for wheezing or shortness of breath. 02/15/15   Elwin MochaWalden, Blair, MD  cyclobenzaprine (FLEXERIL) 10 MG tablet Take 1 tablet (10 mg total) by mouth 2 (two) times daily as needed for muscle spasms. 06/09/15   Danelle Berryapia, Leisa, PA-C  diphenhydrAMINE (BENADRYL) 25 mg capsule Take 1 capsule (25 mg total) by mouth every 6 (six) hours as needed for itching. 04/08/17   Trixie DredgeWest, Adreanne Yono, PA-C  HYDROcodone-acetaminophen (NORCO/VICODIN) 5-325 MG per tablet Take 2  tablets by mouth every 4 (four) hours as needed. 06/09/15   Danelle Berryapia, Leisa, PA-C  ibuprofen (ADVIL,MOTRIN) 800 MG tablet Take 1 tablet (800 mg total) by mouth 3 (three) times daily. 06/09/15   Danelle Berryapia, Leisa, PA-C  levofloxacin (LEVAQUIN) 750 MG tablet Take 1 tablet (750 mg total) by mouth daily. X 7 days 02/15/15   Elwin MochaWalden, Blair, MD  predniSONE (DELTASONE) 20 MG tablet Take 2 tablets (40 mg total) by mouth daily. 06/09/15   Danelle Berryapia, Leisa, PA-C    Family History History reviewed. No pertinent family history.  Social History Social History  Substance Use Topics  . Smoking status: Current Every Day Smoker    Packs/day: 0.50    Types: Cigarettes  . Smokeless tobacco: Never Used  . Alcohol use Yes     Comment: socially     Allergies   Patient has no known allergies.   Review of Systems Review of Systems  Constitutional: Negative for fever.  Musculoskeletal: Positive for arthralgias (right hand) and joint swelling (right hand).  Skin: Positive for color change (redness to affected areas) and wound (insect bite to right hand).  Allergic/Immunologic: Negative for immunocompromised state.  Neurological: Negative for weakness and numbness.  Hematological: Does not bruise/bleed easily.  Psychiatric/Behavioral: Negative for self-injury.     Physical Exam Updated Vital Signs BP 134/77 (BP Location: Right Arm)   Pulse 70   Temp 97.9 F (36.6 C) (Oral)   Resp 18  Ht 5\' 7"  (1.702 m)   Wt 130 lb (59 kg)   LMP 04/07/2017   SpO2 100%   BMI 20.36 kg/m   Physical Exam  Constitutional: She appears well-developed and well-nourished. No distress.  HENT:  Head: Normocephalic and atraumatic.  Neck: Neck supple.  Pulmonary/Chest: Effort normal.  Neurological: She is alert.  Skin: She is not diaphoretic. There is erythema.  Dorsal right hand with edema, erythema that is blanching. Small insect bite between 3rd and 4th MCPs.  No induration or fluctuance.  Full active range of motion of all  digits, strength 5/5, sensation intact, capillary refill < 2 seconds.   Nursing note and vitals reviewed.    ED Treatments / Results  DIAGNOSTIC STUDIES: Oxygen Saturation is 100% on RA, nl by my interpretation.    COORDINATION OF CARE: 6:57 PM Discussed treatment plan with pt at bedside and pt agreed to plan.   Procedures Procedures (including critical care time)  Medications Ordered in ED Medications  diphenhydrAMINE (BENADRYL) capsule 25 mg (not administered)     Initial Impression / Assessment and Plan / ED Course  I have reviewed the triage vital signs and the nursing notes.   Afebrile, nontoxic patient with insect bite to dorsal hand with localized reaction.  This occurred today.  Doubt cellulitis, abscess.   No airway concerns or clinical concern for systemic allergic reaction. D/C home with benadryl, recommendation for ice, elevation.  Discussed result, findings, treatment, and follow up  with patient.  Pt given return precautions.  Pt verbalizes understanding and agrees with plan.       Final Clinical Impressions(s) / ED Diagnoses   Final diagnoses:  Insect bite, initial encounter    New Prescriptions New Prescriptions   DIPHENHYDRAMINE (BENADRYL) 25 MG CAPSULE    Take 1 capsule (25 mg total) by mouth every 6 (six) hours as needed for itching.   I personally performed the services described in this documentation, which was scribed in my presence. The recorded information has been reviewed and is accurate.     Trixie Dredge, PA-C 04/08/17 1920    Nira Conn, MD 04/09/17 0030

## 2018-01-05 ENCOUNTER — Emergency Department (HOSPITAL_COMMUNITY)
Admission: EM | Admit: 2018-01-05 | Discharge: 2018-01-05 | Disposition: A | Discharge status: Home / Self Care | Payer: 59 | Attending: Emergency Medicine | Admitting: Emergency Medicine

## 2018-01-05 ENCOUNTER — Encounter (HOSPITAL_COMMUNITY): Payer: Self-pay | Admitting: Emergency Medicine

## 2018-01-05 DIAGNOSIS — Z79899 Other long term (current) drug therapy: Secondary | ICD-10-CM | POA: Insufficient documentation

## 2018-01-05 DIAGNOSIS — H05012 Cellulitis of left orbit: Secondary | ICD-10-CM | POA: Insufficient documentation

## 2018-01-05 DIAGNOSIS — H00036 Abscess of eyelid left eye, unspecified eyelid: Secondary | ICD-10-CM

## 2018-01-05 DIAGNOSIS — J45909 Unspecified asthma, uncomplicated: Secondary | ICD-10-CM | POA: Insufficient documentation

## 2018-01-05 DIAGNOSIS — F17210 Nicotine dependence, cigarettes, uncomplicated: Secondary | ICD-10-CM | POA: Insufficient documentation

## 2018-01-05 MED ORDER — SULFAMETHOXAZOLE-TRIMETHOPRIM 800-160 MG PO TABS: 1.0000 | ORAL_TABLET | Freq: Two times a day (BID) | ORAL | 0 refills | Status: AC

## 2018-01-05 MED ORDER — ERYTHROMYCIN 5 MG/GM OP OINT
TOPICAL_OINTMENT | Freq: Four times a day (QID) | OPHTHALMIC | Status: DC
Start: 2018-01-05 — End: 2018-01-05
  Administered 2018-01-05: 09:00:00 via OPHTHALMIC
  Filled 2018-01-05: qty 3.5

## 2018-01-05 NOTE — ED Triage Notes (Signed)
Patient c/o left eye swelling that started yesterday. Denies drainage but reports pain and swelling.

## 2018-01-05 NOTE — Discharge Instructions (Signed)
Warm compresses topically. Erythromycin ointment every 6 hrs. Bactrim as prescribed until all gone. Follow up with family doctor. Return if worsening.

## 2018-01-05 NOTE — ED Provider Notes (Signed)
[facility]-EMERGENCY DEPT Provider Note   CSN: [id number] Arrival date & time: 01/05/18  0751     History   Chief Complaint Chief Complaint  Patient presents with  . Eye Pain    HPI Stephanie Robertson is a 27 y.o. female.  HPI Stephanie Robertson is a 27 y.o. female with history of asthma, presents to emergency department complaining of left eyelid swelling.  And noticed eyelid swelling yesterday evening while getting ready to go to bed.  She states is very tender to the touch.  Denies blurred vision.  Denies drainage.  No injury to the eye. Pt does reports mild pain with looking to the left. No photophobia. Does not wear contacts or glasses. No fever or chills.   Past Medical History:  Diagnosis Date  . Asthma     There are no active problems to display for this patient.   History reviewed. No pertinent surgical history.  OB History    No data available       Home Medications    Prior to Admission medications   Medication Sig Start Date End Date Taking? Authorizing Provider  albuterol (PROVENTIL HFA;VENTOLIN HFA) 108 (90 BASE) MCG/ACT inhaler Inhale 2 puffs into the lungs every 4 (four) hours as needed for wheezing or shortness of breath. 02/15/15   Elwin Mocha, MD  cyclobenzaprine (FLEXERIL) 10 MG tablet Take 1 tablet (10 mg total) by mouth 2 (two) times daily as needed for muscle spasms. 06/09/15   Danelle Berry, PA-C  diphenhydrAMINE (BENADRYL) 25 mg capsule Take 1 capsule (25 mg total) by mouth every 6 (six) hours as needed for itching. 04/08/17   Trixie Dredge, PA-C  HYDROcodone-acetaminophen (NORCO/VICODIN) 5-325 MG per tablet Take 2 tablets by mouth every 4 (four) hours as needed. 06/09/15   Danelle Berry, PA-C  ibuprofen (ADVIL,MOTRIN) 800 MG tablet Take 1 tablet (800 mg total) by mouth 3 (three) times daily. 06/09/15   Danelle Berry, PA-C  levofloxacin (LEVAQUIN) 750 MG tablet Take 1 tablet (750 mg total) by mouth daily. X 7 days 02/15/15   Elwin Mocha,  MD  predniSONE (DELTASONE) 20 MG tablet Take 2 tablets (40 mg total) by mouth daily. 06/09/15   Danelle Berry, PA-C    Family History No family history on file.  Social History Social History   Tobacco Use  . Smoking status: Current Every Day Smoker    Packs/day: 0.50    Types: Cigarettes  . Smokeless tobacco: Never Used  Substance Use Topics  . Alcohol use: Yes    Comment: socially  . Drug use: No     Allergies   Patient has no known allergies.   Review of Systems Review of Systems  Constitutional: Negative for fever.  HENT: Negative for congestion, ear pain and facial swelling.   Eyes: Positive for pain and redness. Negative for photophobia, discharge, itching and visual disturbance.  Neurological: Negative for dizziness and headaches.  All other systems reviewed and are negative.    Physical Exam Updated Vital Signs BP 119/77 (BP Location: Right Arm)   Pulse 69   Temp 98.7 F (37.1 C) (Oral)   Resp 16   Ht 5\' 7"  (1.702 m)   Wt 59 kg (130 lb)   LMP 01/02/2018   SpO2 100%   BMI 20.36 kg/m   Physical Exam  Constitutional: She appears well-developed and well-nourished. No distress.  Eyes: Conjunctivae and EOM are normal. Pupils are equal, round, and reactive to light.  Normal pupil and conjunctiva. Moderate  swelling to the left eyelid with no definite pustule/hordeolum. Mild pain with lateral gaze  Neck: Neck supple.  Neurological: She is alert.  Skin: Skin is warm and dry.  Nursing note and vitals reviewed.    ED Treatments / Results  Labs (all labs ordered are listed, but only abnormal results are displayed) Labs Reviewed - No data to display  EKG  EKG Interpretation None       Radiology No results found.  Procedures Procedures (including critical care time)  Medications Ordered in ED Medications - No data to display   Initial Impression / Assessment and Plan / ED Course  I have reviewed the triage vital signs and the nursing  notes.  Pertinent labs & imaging results that were available during my care of the patient were reviewed by me and considered in my medical decision making (see chart for details).     Pt with painful left upper eyelid swelling. Pt otherwise afebrile, non toxic appearing. No ptosis. Just mild pain with lateral gaze. Exam most consistent with hordeolum although I am unable to see a definite pustule. Her eyelid is every tender and red. Will cover for possible cellulitis. Doubt orbital cellulitis at this time. Plan to treat with erythromycin ointment and bactrim. Follow up with pcp. Return precuations discussed.   Vitals:   01/05/18 0758  BP: 119/77  Pulse: 69  Resp: 16  Temp: 98.7 F (37.1 C)  TempSrc: Oral  SpO2: 100%  Weight: 59 kg (130 lb)  Height: 5\' 7"  (1.702 m)     Final Clinical Impressions(s) / ED Diagnoses   Final diagnoses:  Eyelid cellulitis, left    ED Discharge Orders        Ordered    sulfamethoxazole-trimethoprim (BACTRIM DS,SEPTRA DS) 800-160 MG tablet  2 times daily     01/05/18 0905       Jaynie CrumbleKirichenko, Jyden Kromer, PA-C 01/05/18 [id number]    Cathren LaineSteinl, Kevin, MD 01/05/18 [phone]

## 2018-09-09 ENCOUNTER — Other Ambulatory Visit: Payer: Self-pay

## 2018-09-09 ENCOUNTER — Emergency Department (HOSPITAL_COMMUNITY)
Admission: EM | Admit: 2018-09-09 | Discharge: 2018-09-10 | Disposition: A | Discharge status: Home / Self Care | Payer: Self-pay | Attending: Emergency Medicine | Admitting: Emergency Medicine

## 2018-09-09 ENCOUNTER — Encounter (HOSPITAL_COMMUNITY): Payer: Self-pay | Admitting: Emergency Medicine

## 2018-09-09 ENCOUNTER — Emergency Department (HOSPITAL_COMMUNITY): Payer: Self-pay

## 2018-09-09 DIAGNOSIS — F17210 Nicotine dependence, cigarettes, uncomplicated: Secondary | ICD-10-CM | POA: Insufficient documentation

## 2018-09-09 DIAGNOSIS — K529 Noninfective gastroenteritis and colitis, unspecified: Secondary | ICD-10-CM | POA: Insufficient documentation

## 2018-09-09 DIAGNOSIS — J45909 Unspecified asthma, uncomplicated: Secondary | ICD-10-CM | POA: Insufficient documentation

## 2018-09-09 DIAGNOSIS — Z79899 Other long term (current) drug therapy: Secondary | ICD-10-CM | POA: Insufficient documentation

## 2018-09-09 LAB — CBC
HCT: 41.3 % (ref 36.0–46.0)
HEMOGLOBIN: 13.8 g/dL (ref 12.0–15.0)
MCH: 31.8 pg (ref 26.0–34.0)
MCHC: 33.4 g/dL (ref 30.0–36.0)
MCV: 95.2 fL (ref 80.0–100.0)
Platelets: 231 10*3/uL (ref 150–400)
RBC: 4.34 MIL/uL (ref 3.87–5.11)
RDW: 12.4 % (ref 11.5–15.5)
WBC: 12.1 10*3/uL — AB (ref 4.0–10.5)
nRBC: 0 % (ref 0.0–0.2)

## 2018-09-09 LAB — COMPREHENSIVE METABOLIC PANEL
ALBUMIN: 4.1 g/dL (ref 3.5–5.0)
ALT: 12 U/L (ref 0–44)
AST: 20 U/L (ref 15–41)
Alkaline Phosphatase: 66 U/L (ref 38–126)
Anion gap: 12 (ref 5–15)
BUN: 9 mg/dL (ref 6–20)
CO2: 23 mmol/L (ref 22–32)
CREATININE: 0.94 mg/dL (ref 0.44–1.00)
Calcium: 9.5 mg/dL (ref 8.9–10.3)
Chloride: 105 mmol/L (ref 98–111)
GFR calc Af Amer: >60 mL/min (ref >60)
GFR calc non Af Amer: >60 mL/min (ref >60)
Glucose, Bld: 126 mg/dL — ABNORMAL HIGH (ref 70–99)
Potassium: 3.4 mmol/L — ABNORMAL LOW (ref 3.5–5.1)
Sodium: 140 mmol/L (ref 135–145)
TOTAL PROTEIN: 7.6 g/dL (ref 6.5–8.1)
Total Bilirubin: 0.8 mg/dL (ref 0.3–1.2)

## 2018-09-09 LAB — I-STAT BETA HCG BLOOD, ED (MC, WL, AP ONLY)

## 2018-09-09 LAB — LIPASE, BLOOD: Lipase: 31 U/L (ref 11–51)

## 2018-09-09 MED ORDER — ONDANSETRON 4 MG PO TBDP
4.0000 mg | ORAL_TABLET | Freq: Once | ORAL | Status: AC | PRN
  Administered 2018-09-09: 4 mg via ORAL
  Filled 2018-09-09: qty 1

## 2018-09-09 MED ORDER — PROMETHAZINE HCL 25 MG/ML IJ SOLN
25.0000 mg | Freq: Once | INTRAMUSCULAR | Status: AC
  Administered 2018-09-09: 25 mg via INTRAVENOUS
  Filled 2018-09-09: qty 1

## 2018-09-09 MED ORDER — HALOPERIDOL LACTATE 5 MG/ML IJ SOLN
2.0000 mg | Freq: Once | INTRAMUSCULAR | Status: AC
  Administered 2018-09-09: 2 mg via INTRAVENOUS
  Filled 2018-09-09: qty 1

## 2018-09-09 MED ORDER — SODIUM CHLORIDE 0.9 % IV BOLUS
1000.0000 mL | Freq: Once | INTRAVENOUS | Status: AC
  Administered 2018-09-09: 1000 mL via INTRAVENOUS

## 2018-09-09 MED ORDER — IOPAMIDOL (ISOVUE-300) INJECTION 61%
INTRAVENOUS | Status: AC
  Filled 2018-09-09: qty 100

## 2018-09-09 MED ORDER — IOPAMIDOL (ISOVUE-300) INJECTION 61%
100.0000 mL | Freq: Once | INTRAVENOUS | Status: AC | PRN
  Administered 2018-09-09: 100 mL via INTRAVENOUS

## 2018-09-09 NOTE — ED Triage Notes (Addendum)
Patient complaining of vomiting, nausea, chills, and abdominal pain. Patient states this started 4 days ago. Patient states her upper abdomen is hurting her.

## 2018-09-09 NOTE — ED Notes (Signed)
PT INFORMED UA IS NEEDED! 

## 2018-09-09 NOTE — ED Provider Notes (Signed)
[facility]-EMERGENCY DEPT Provider Note   CSN: [id number] Arrival date & time: 09/09/18  2043     History   Chief Complaint Chief Complaint  Patient presents with  . Emesis  . Abdominal Pain    HPI Stephanie Robertson is a 27 y.o. female.  HPI  Stephanie Robertson is a 27 year old female with no significant past medical history who presents to the emergency department for evaluation of vomiting, diarrhea, chills and epigastric abdominal pain.  Patient reports that her symptoms started 2 days ago but worsened today.  She reports severe epigastric pain which "feels like someone stabbing me" and is constant.  Pain is worsened with palpation and with taking a deep breath.  She reports that she has had tactile fever with chills and has been taking Tylenol at home, last at 11 AM today.  No measured temperature.  She has had about 6 episodes of nonbloody emesis today and 2 episodes of nonbloody diarrhea.  No recent antibiotic use or travel outside of the country.  No sick contacts with similar symptoms.  She has not had any prior abdominal surgeries.  She is currently on her menstrual cycle.  She denies chest pain, shortness of breath, melena, dysuria, urinary frequency, flank pain, lightheadedness or syncope.  She uses marijuana daily including smoking a joint today.  Past Medical History:  Diagnosis Date  . Asthma     There are no active problems to display for this patient.   History reviewed. No pertinent surgical history.   OB History   None      Home Medications    Prior to Admission medications   Medication Sig Start Date End Date Taking? Authorizing Provider  albuterol (PROVENTIL HFA;VENTOLIN HFA) 108 (90 Base) MCG/ACT inhaler Inhale 2 puffs into the lungs 4 (four) times daily as needed. 02/15/15  Yes [provider]  Ibuprofen-diphenhydrAMINE Cit 200-38 MG TABS Take 200 tablets by mouth 2 (two) times daily.   Yes [provider]    albuterol (PROVENTIL HFA;VENTOLIN HFA) 108 (90 BASE) MCG/ACT inhaler Inhale 2 puffs into the lungs every 4 (four) hours as needed for wheezing or shortness of breath. 02/15/15   Elwin Mocha, MD  cyclobenzaprine (FLEXERIL) 10 MG tablet Take by mouth.    [provider]  diphenhydrAMINE (BENADRYL) 25 mg capsule Take 1 capsule (25 mg total) by mouth every 6 (six) hours as needed for itching. 04/08/17   Trixie Dredge, PA-C  HYDROcodone-acetaminophen (NORCO/VICODIN) 5-325 MG per tablet Take 2 tablets by mouth every 4 (four) hours as needed. 06/09/15   Danelle Berry, PA-C  ibuprofen (ADVIL,MOTRIN) 800 MG tablet Take 1 tablet (800 mg total) by mouth 3 (three) times daily. 06/09/15   Danelle Berry, PA-C  levofloxacin (LEVAQUIN) 750 MG tablet Take 1 tablet (750 mg total) by mouth daily. X 7 days 02/15/15   Elwin Mocha, MD  predniSONE (DELTASONE) 20 MG tablet Take 2 tablets (40 mg total) by mouth daily. 06/09/15   Danelle Berry, PA-C    Family History History reviewed. No pertinent family history.  Social History Social History   Tobacco Use  . Smoking status: Current Every Day Smoker    Packs/day: 0.50    Types: Cigarettes  . Smokeless tobacco: Never Used  Substance Use Topics  . Alcohol use: Yes    Comment: socially  . Drug use: No    Frequency: 7.0 times per week     Allergies   Patient has no known allergies.   Review of  Systems Review of Systems  Constitutional: Positive for chills and fever.  Respiratory: Negative for shortness of breath.   Cardiovascular: Negative for chest pain.  Gastrointestinal: Positive for abdominal pain, diarrhea, nausea and vomiting. Negative for blood in stool.  Genitourinary: Positive for vaginal bleeding. Negative for difficulty urinating, dysuria, flank pain, frequency, hematuria, vaginal discharge and vaginal pain.  Musculoskeletal: Negative for back pain and gait problem.  Neurological: Negative for syncope and light-headedness.  All other systems  reviewed and are negative.    Physical Exam Updated Vital Signs BP 118/86 (BP Location: Left Arm)   Pulse 67   Temp 97.8 F (36.6 C) (Oral)   Resp 16   Ht 5\' 7"  (1.702 m)   Wt 59 kg   LMP 09/09/2018   SpO2 100%   BMI 20.36 kg/m   Physical Exam  Constitutional: She is oriented to person, place, and time. She appears well-developed and well-nourished. No distress.  Appears uncomfortable, retching at the bedside.   HENT:  Head: Normocephalic and atraumatic.  Mucous membranes moist.   Eyes: Pupils are equal, round, and reactive to light. Conjunctivae are normal. Right eye exhibits no discharge. Left eye exhibits no discharge.  Neck: Normal range of motion.  Cardiovascular: Normal rate, regular rhythm and intact distal pulses.  Pulmonary/Chest: Effort normal and breath sounds normal. No stridor. No respiratory distress. She has no wheezes. She has no rales.  Abdominal:  Difficult exam given patient guarding. Bowel sounds normoactive.  She is generally tender throughout the abdomen, although more so over the epigastrium. No surgical scars.  No CVA tenderness.   Musculoskeletal: Normal range of motion.  Neurological: She is alert and oriented to person, place, and time. Coordination normal.  Skin: Skin is warm and dry. Capillary refill takes less than 2 seconds. She is not diaphoretic.  Psychiatric: She has a normal mood and affect. Her behavior is normal.  Nursing note and vitals reviewed.  ED Treatments / Results  Labs (all labs ordered are listed, but only abnormal results are displayed) Labs Reviewed  COMPREHENSIVE METABOLIC PANEL - Abnormal; Notable for the following components:      Result Value   Potassium 3.4 (*)    Glucose, Bld 126 (*)    All other components within normal limits  CBC - Abnormal; Notable for the following components:   WBC 12.1 (*)    All other components within normal limits  URINALYSIS, ROUTINE W REFLEX MICROSCOPIC - Abnormal; Notable for the  following components:   Specific Gravity, Urine >1.046 (*)    Hgb urine dipstick MODERATE (*)    Ketones, ur 5 (*)    All other components within normal limits  LIPASE, BLOOD  I-STAT BETA HCG BLOOD, ED ([facility], [facility], [facility] ONLY)    EKG None  Radiology Ct Abdomen Pelvis W Contrast  Result Date: 09/09/2018 CLINICAL DATA:  Vomiting, nausea and chills starting 4 days ago. Pain in the upper abdomen. EXAM: CT ABDOMEN AND PELVIS WITH CONTRAST TECHNIQUE: Multidetector CT imaging of the abdomen and pelvis was performed using the standard protocol following bolus administration of intravenous contrast. CONTRAST:  ISOVUE-300 IOPAMIDOL (ISOVUE-300) INJECTION 61% COMPARISON:  None. FINDINGS: LOWER CHEST: Lung bases are clear. Included heart size is normal. No pericardial effusion. HEPATOBILIARY: Liver and gallbladder are normal. PANCREAS: Normal. SPLEEN: Normal. ADRENALS/URINARY TRACT: Kidneys are orthotopic in appearance. Slightly striated enhancement of the kidneys can be seen and pyelonephritis and should be correlated. Otherwise this may simply reflect timing of contrast. No nephrolithiasis, hydronephrosis  or solid renal masses. Urinary bladder is partially distended and unremarkable. Normal adrenal glands. STOMACH/BOWEL: The stomach is decompressed in appearance. A few loops of jejunum in the left upper quadrant are mildly distended with air. This is a nonspecific bowel gas pattern. Otherwise the remainder the small bowel is unremarkable. A moderate amount of retained stool is seen within the colon. Normal-appearing appendix. VASCULAR/LYMPHATIC: Aortoiliac vessels are normal in course and caliber. No lymphadenopathy by CT size criteria. REPRODUCTIVE: The uterus and adnexa are unremarkable. OTHER: No intraperitoneal free fluid or free air. MUSCULOSKELETAL: Disc flattening L4-5 associated with small central disc herniation. Disc flattening at L5-S1 mild broad-based disc bulge. IMPRESSION: 1. Mild distention of  proximal small bowel query enteritis. No mechanical bowel obstruction. 2. Slightly striated appearance of the kidneys may simply reflect timing of contrast through the kidneys. Otherwise, this can be seen the setting of pyelonephritis. Correlation with urinalysis is recommended. 3. Small central disc herniation L4-5 with broad-based disc bulge at L5-S1. Electronically Signed   By: Tollie Eth M.D.   On: 09/09/2018 23:19    Procedures Procedures (including critical care time)  Medications Ordered in ED Medications  ondansetron (ZOFRAN-ODT) disintegrating tablet 4 mg (4 mg Oral Given 09/09/18 2128)  sodium chloride 0.9 % bolus 1,000 mL (0 mLs Intravenous Stopped 09/09/18 2352)  promethazine (PHENERGAN) injection 25 mg (25 mg Intravenous Given 09/09/18 2247)  haloperidol lactate (HALDOL) injection 2 mg (2 mg Intravenous Given 09/09/18 2247)  iopamidol (ISOVUE-300) 61 % injection 100 mL (100 mLs Intravenous Contrast Given 09/09/18 2252)     Initial Impression / Assessment and Plan / ED Course  I have reviewed the triage vital signs and the nursing notes.  Pertinent labs & imaging results that were available during my care of the patient were reviewed by me and considered in my medical decision making (see chart for details).      CT abdomen/pelvis reveals mild distention of the proximal small bowel consistent with enteritis.  No bowel obstruction.  This reflects patient's symptoms of diarrhea, vomiting and generalized abdominal pain.  No recent travel outside of the country or hematochezia, symptoms likely viral.  No antibiotics indicated at this time.  Patient treated with antiemetics, IV fluids and reports improvement.  She is able to tolerate p.o. fluids at the bedside.  Repeat abdominal exam soft and nontender to palpation.  Otherwise UA without evidence of infection, no concern for pyelonephritis.  CMP unremarkable, liver enzymes within normal and kidney function within normal.  Lipase negative,  no concern for pancreatitis.  Patient will be discharged with Zofran as needed for nausea.  She is been counseled on BRAT diet.  I have also counseled her to avoid marijuana as this can make her vomiting worse.  Discussed strict return precautions and she agrees and voiced understanding to the above plan and appears reliable.  Final Clinical Impressions(s) / ED Diagnoses   Final diagnoses:  Gastroenteritis    ED Discharge Orders         Ordered    ondansetron (ZOFRAN ODT) 4 MG disintegrating tablet  Every 8 hours PRN     09/10/18 0112           Kellie Shropshire, PA-C 09/10/18 [id number]    Linwood Dibbles, MD 09/11/18 1114

## 2018-09-10 LAB — URINALYSIS, ROUTINE W REFLEX MICROSCOPIC
Bacteria, UA: NONE SEEN
Bilirubin Urine: NEGATIVE
GLUCOSE, UA: NEGATIVE mg/dL
Ketones, ur: 5 mg/dL — AB
LEUKOCYTES UA: NEGATIVE
Nitrite: NEGATIVE
PH: 8 (ref 5.0–8.0)
Protein, ur: NEGATIVE mg/dL

## 2018-09-10 MED ORDER — ONDANSETRON 4 MG PO TBDP: 4.0000 mg | ORAL_TABLET | Freq: Three times a day (TID) | ORAL | 0 refills | Status: AC | PRN

## 2018-09-10 NOTE — ED Notes (Signed)
Pt tolerating PO intake

## 2018-09-10 NOTE — Discharge Instructions (Addendum)
Zofran for nausea/vomiting.   You have stomach flu.  Advance diet slowly.  Start with just clear liquids.  Please stay away from greasy and spicy food foods.   It is important that you avoid marijuana as this can make her symptoms worse.  Come back to the ER for any new or concerning symptoms like vomiting that will not stop, fever.
